# Patient Record
Sex: Female | Born: 1975 | Hispanic: No | Marital: Single | Smoking: Former smoker
Health system: Southern US, Community
[De-identification: ages and names within clinical notes are randomized; demographics above are authoritative.]

## PROBLEM LIST (undated history)

## (undated) DIAGNOSIS — F32A Depression, unspecified: Secondary | ICD-10-CM

## (undated) DIAGNOSIS — G4730 Sleep apnea, unspecified: Secondary | ICD-10-CM

## (undated) DIAGNOSIS — E039 Hypothyroidism, unspecified: Secondary | ICD-10-CM

## (undated) DIAGNOSIS — F329 Major depressive disorder, single episode, unspecified: Secondary | ICD-10-CM

## (undated) DIAGNOSIS — F419 Anxiety disorder, unspecified: Secondary | ICD-10-CM

---

## 2001-10-26 ENCOUNTER — Encounter: Admission: RE | Admit: 2001-10-26 | Discharge: 2001-11-23 | Payer: Self-pay | Admitting: Family Medicine

## 2009-01-21 ENCOUNTER — Emergency Department (HOSPITAL_COMMUNITY): Admission: EM | Admit: 2009-01-21 | Discharge: 2009-01-21 | Payer: Self-pay | Admitting: Family Medicine

## 2009-02-21 ENCOUNTER — Encounter: Admission: RE | Admit: 2009-02-21 | Discharge: 2009-04-19 | Payer: Self-pay | Admitting: Occupational Medicine

## 2009-03-30 ENCOUNTER — Encounter: Admission: RE | Admit: 2009-03-30 | Discharge: 2009-04-05 | Payer: Self-pay | Admitting: Occupational Medicine

## 2009-05-10 ENCOUNTER — Encounter: Admission: RE | Admit: 2009-05-10 | Discharge: 2009-05-29 | Payer: Self-pay | Admitting: Occupational Medicine

## 2010-06-18 ENCOUNTER — Ambulatory Visit: Payer: BC Managed Care – PPO | Attending: Orthopedic Surgery | Admitting: Physical Therapy

## 2010-06-18 DIAGNOSIS — R5381 Other malaise: Secondary | ICD-10-CM | POA: Insufficient documentation

## 2010-06-18 DIAGNOSIS — M25569 Pain in unspecified knee: Secondary | ICD-10-CM | POA: Insufficient documentation

## 2010-06-18 DIAGNOSIS — IMO0001 Reserved for inherently not codable concepts without codable children: Secondary | ICD-10-CM | POA: Insufficient documentation

## 2010-06-20 ENCOUNTER — Ambulatory Visit: Payer: BC Managed Care – PPO | Admitting: Physical Therapy

## 2010-06-26 ENCOUNTER — Ambulatory Visit: Payer: BC Managed Care – PPO | Admitting: Physical Therapy

## 2010-06-28 ENCOUNTER — Ambulatory Visit: Payer: BC Managed Care – PPO | Attending: Orthopedic Surgery | Admitting: Physical Therapy

## 2010-06-28 DIAGNOSIS — IMO0001 Reserved for inherently not codable concepts without codable children: Secondary | ICD-10-CM | POA: Insufficient documentation

## 2010-06-28 DIAGNOSIS — R5381 Other malaise: Secondary | ICD-10-CM | POA: Insufficient documentation

## 2010-06-28 DIAGNOSIS — M25569 Pain in unspecified knee: Secondary | ICD-10-CM | POA: Insufficient documentation

## 2010-07-02 ENCOUNTER — Ambulatory Visit: Payer: BC Managed Care – PPO | Admitting: Physical Therapy

## 2010-07-05 ENCOUNTER — Ambulatory Visit: Payer: BC Managed Care – PPO | Admitting: Physical Therapy

## 2010-07-09 ENCOUNTER — Ambulatory Visit: Payer: BC Managed Care – PPO | Admitting: Physical Therapy

## 2013-02-23 ENCOUNTER — Other Ambulatory Visit: Payer: Self-pay | Admitting: Nurse Practitioner

## 2014-03-02 ENCOUNTER — Ambulatory Visit
Admission: RE | Admit: 2014-03-02 | Discharge: 2014-03-02 | Disposition: A | Discharge status: Home / Self Care | Payer: BC Managed Care – PPO | Source: Ambulatory Visit | Attending: Family Medicine | Admitting: Family Medicine

## 2014-03-02 ENCOUNTER — Other Ambulatory Visit: Payer: Self-pay | Admitting: Family Medicine

## 2014-03-02 DIAGNOSIS — M25531 Pain in right wrist: Secondary | ICD-10-CM

## 2015-04-26 IMAGING — CR DG WRIST COMPLETE 3+V*R*
2 series · 2 of 2 positions shown · non-contrast
Comparison: None.

CLINICAL DATA: Medial wrist pain for several weeks, no injury

EXAM:
RIGHT WRIST - COMPLETE 3+ VIEW

[view not recorded (1 of 2)]
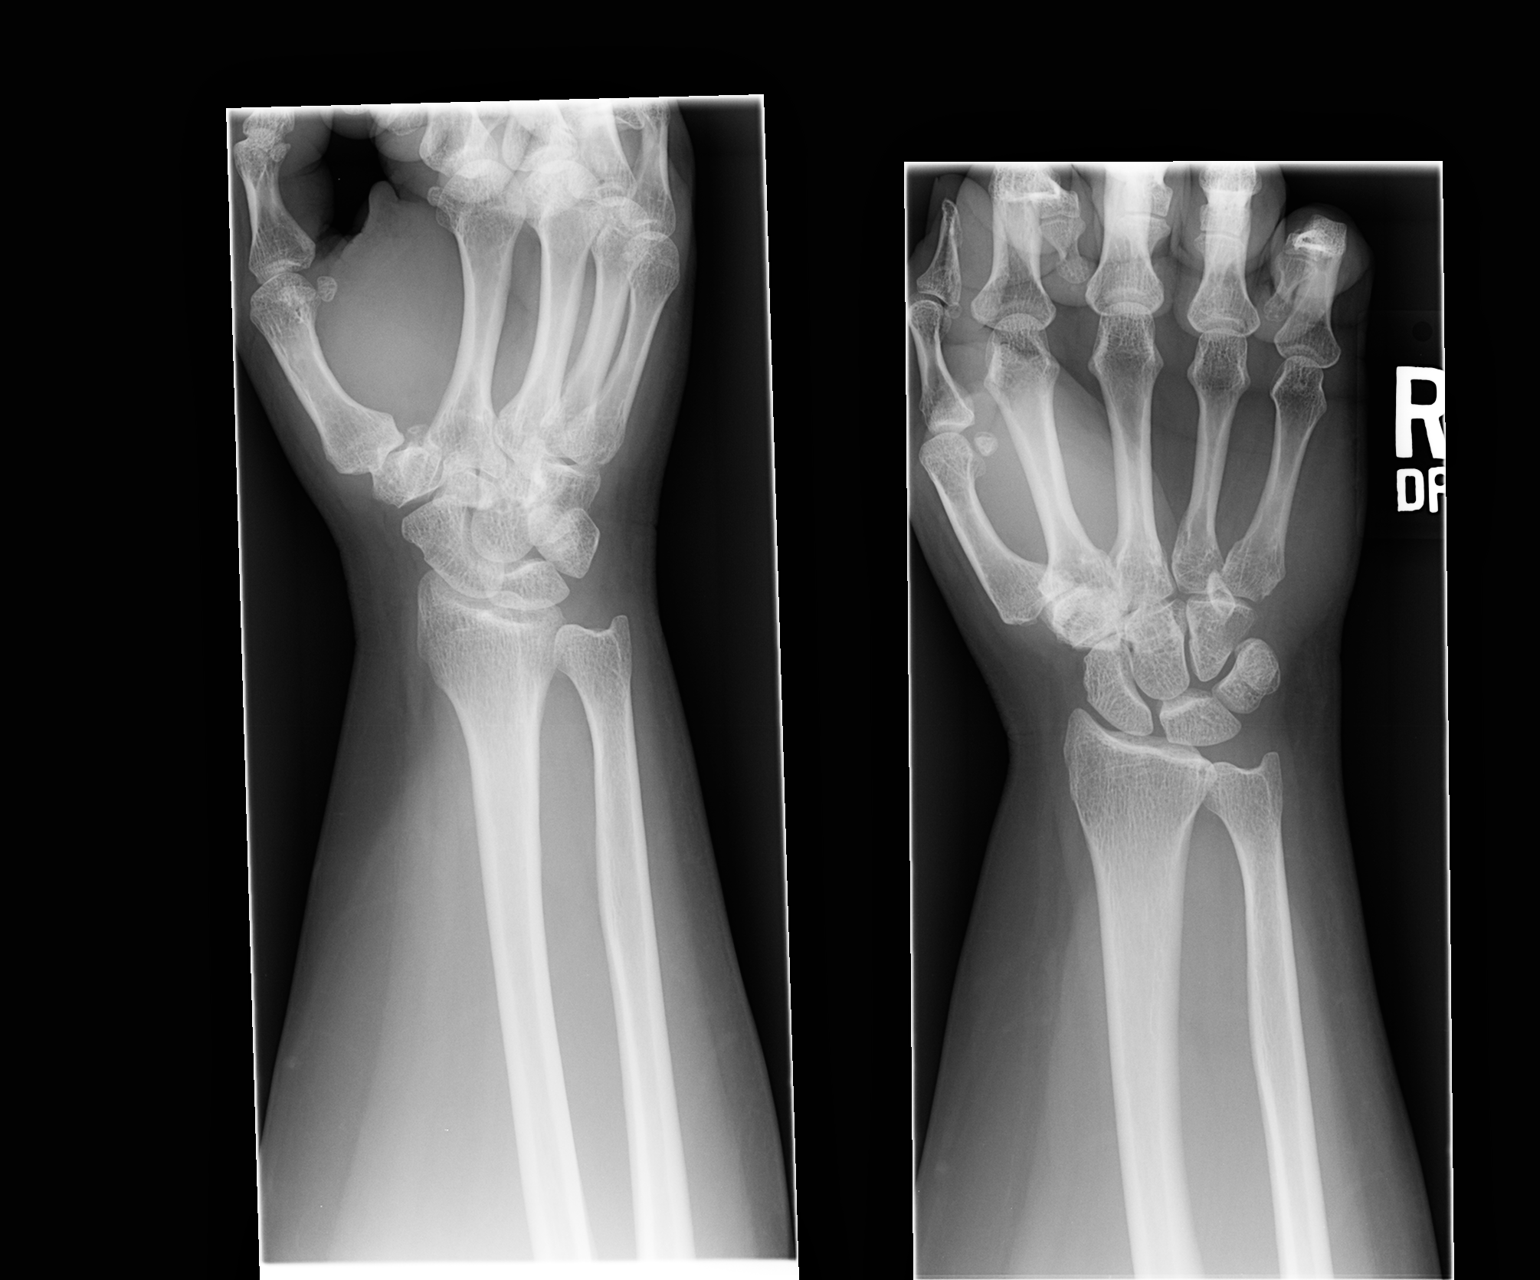

[view not recorded (2 of 2)]
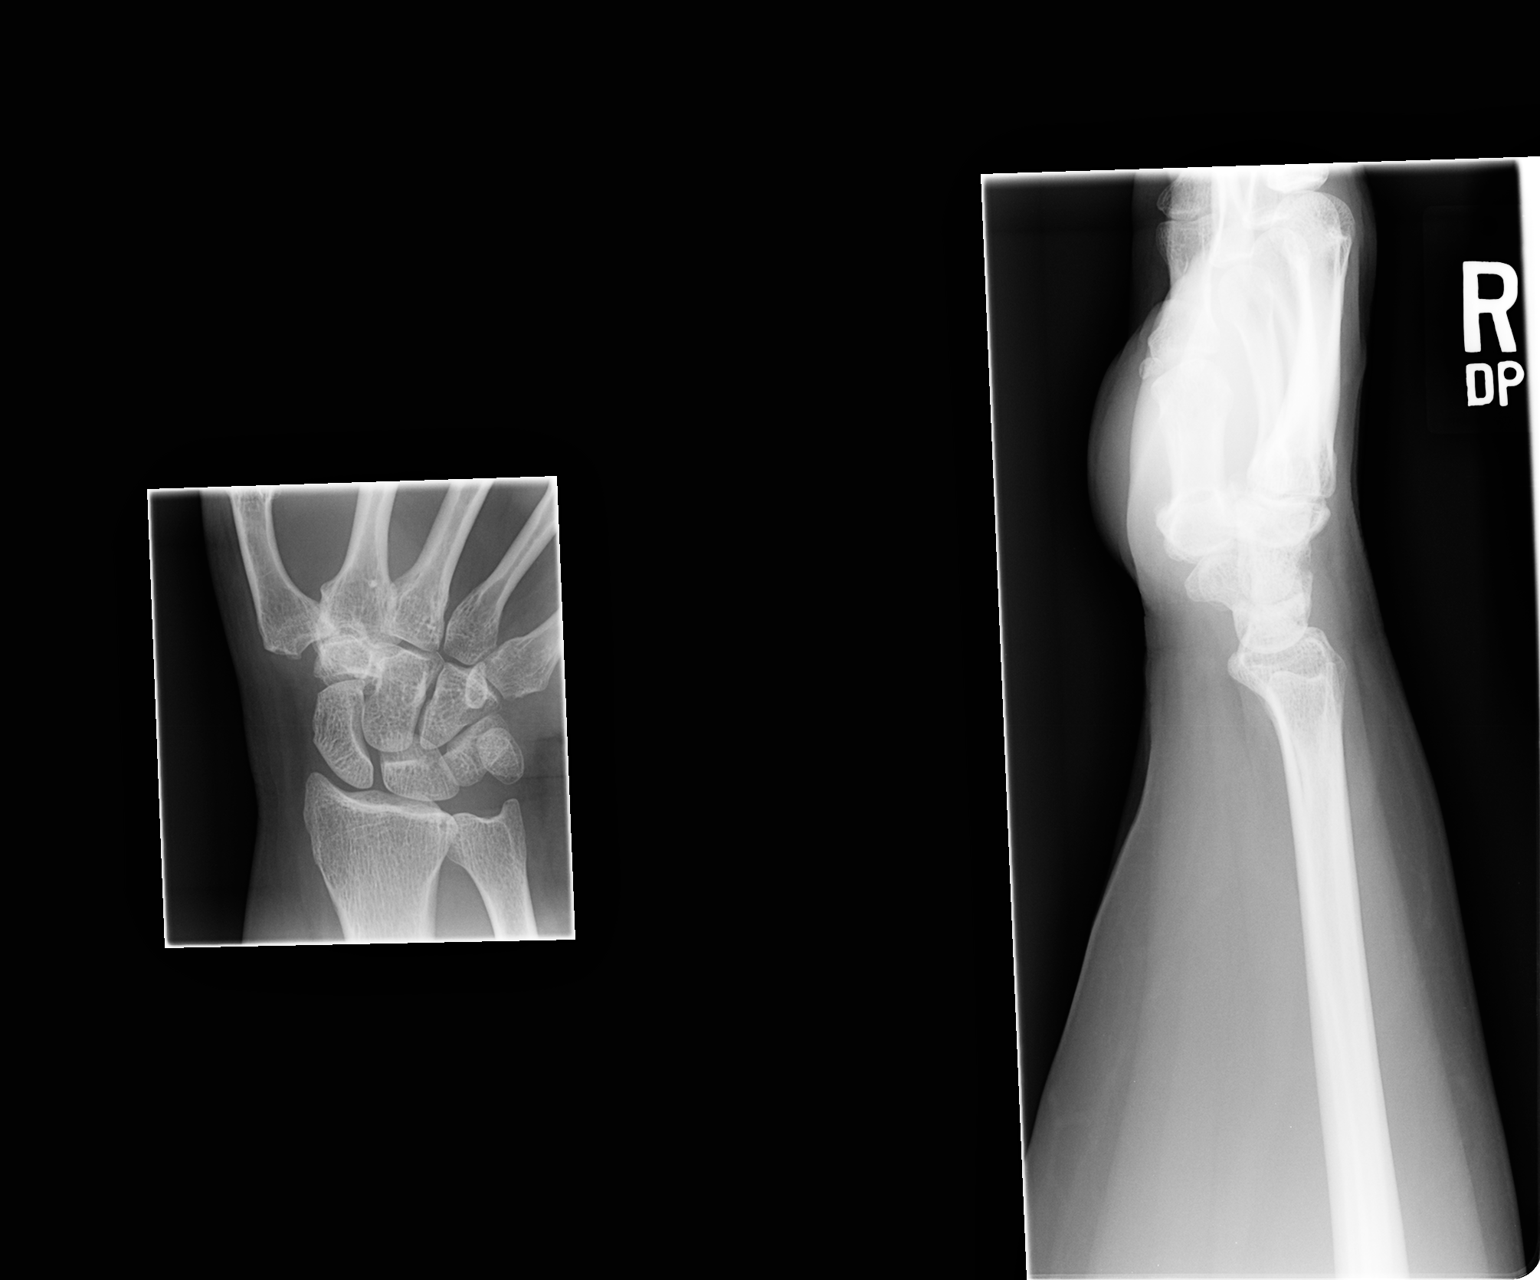

[2 of 2 positions shown; findings below may reference images not displayed]

FINDINGS: The radiocarpal joint space is unremarkable. The ulnar styloid is
intact. The carpal bones are in normal position. No acute
abnormality is seen and no significant degenerative change is noted.
IMPRESSION: Negative.

## 2018-03-31 ENCOUNTER — Encounter (HOSPITAL_COMMUNITY): Payer: Self-pay | Admitting: *Deleted

## 2018-03-31 ENCOUNTER — Emergency Department (HOSPITAL_COMMUNITY): Payer: Managed Care, Other (non HMO)

## 2018-03-31 ENCOUNTER — Emergency Department (HOSPITAL_COMMUNITY)
Admission: EM | Admit: 2018-03-31 | Discharge: 2018-03-31 | Disposition: A | Discharge status: Home / Self Care | Payer: Managed Care, Other (non HMO) | Attending: Emergency Medicine | Admitting: Emergency Medicine

## 2018-03-31 DIAGNOSIS — R0789 Other chest pain: Secondary | ICD-10-CM | POA: Diagnosis present

## 2018-03-31 DIAGNOSIS — E039 Hypothyroidism, unspecified: Secondary | ICD-10-CM | POA: Insufficient documentation

## 2018-03-31 DIAGNOSIS — Z79899 Other long term (current) drug therapy: Secondary | ICD-10-CM | POA: Diagnosis not present

## 2018-03-31 DIAGNOSIS — Z87891 Personal history of nicotine dependence: Secondary | ICD-10-CM | POA: Insufficient documentation

## 2018-03-31 HISTORY — DX: Depression, unspecified: F32.A

## 2018-03-31 HISTORY — DX: Major depressive disorder, single episode, unspecified: F32.9

## 2018-03-31 HISTORY — DX: Sleep apnea, unspecified: G47.30

## 2018-03-31 HISTORY — DX: Hypothyroidism, unspecified: E03.9

## 2018-03-31 HISTORY — DX: Anxiety disorder, unspecified: F41.9

## 2018-03-31 LAB — I-STAT TROPONIN, ED
Troponin i, poc: 0.01 ng/mL (ref 0.00–0.08)
Troponin i, poc: 0 ng/mL (ref 0.00–0.08)

## 2018-03-31 LAB — BASIC METABOLIC PANEL
Anion gap: 8 (ref 5–15)
BUN: 21 mg/dL — ABNORMAL HIGH (ref 6–20)
CO2: 27 mmol/L (ref 22–32)
Calcium: 8.9 mg/dL (ref 8.9–10.3)
Chloride: 107 mmol/L (ref 98–111)
Creatinine, Ser: 0.95 mg/dL (ref 0.44–1.00)
GFR calc Af Amer: >60 mL/min (ref >60)
GFR calc non Af Amer: >60 mL/min (ref >60)
Glucose, Bld: 109 mg/dL — ABNORMAL HIGH (ref 70–99)
Potassium: 4.4 mmol/L (ref 3.5–5.1)
Sodium: 142 mmol/L (ref 135–145)

## 2018-03-31 LAB — CBC
HCT: 41.9 % (ref 36.0–46.0)
Hemoglobin: 13.7 g/dL (ref 12.0–15.0)
MCH: 30.5 pg (ref 26.0–34.0)
MCHC: 32.7 g/dL (ref 30.0–36.0)
MCV: 93.3 fL (ref 80.0–100.0)
Platelets: 226 10*3/uL (ref 150–400)
RBC: 4.49 MIL/uL (ref 3.87–5.11)
RDW: 12.6 % (ref 11.5–15.5)
WBC: 11.2 10*3/uL — ABNORMAL HIGH (ref 4.0–10.5)
nRBC: 0 % (ref 0.0–0.2)

## 2018-03-31 LAB — I-STAT BETA HCG BLOOD, ED (MC, WL, AP ONLY): I-stat hCG, quantitative: <5 m[IU]/mL (ref <5)

## 2018-03-31 NOTE — ED Provider Notes (Signed)
[facility]   CSN: [id number] Arrival date & time: 03/31/18  1531     History   Chief Complaint Chief Complaint  Patient presents with  . Chest Pain    HPI Jasmine Vincent is a 42 y.o. female with past medical history of anxiety, depression, hypothyroid, sleep apnea, presenting to the emergency department with complaint of gradual onset of chest pressure that began this morning around 10:30 AM.  Pain is intermittent, associated with palpitations described as her heart "beating really hard."  She states her heart is not beating fast, it was just beating hard.  Symptoms began while she was working at her computer.  She endorses increase stress lately with her job.  Symptoms are not associated with shortness of breath, nausea, or diaphoresis.  She does have some intermittent lightheadedness today.  Similar symptoms 2 weeks ago, lasted only about a minute in duration.  Early this morning she took Sudafed for a sinus infection.  No other medications tried for her symptoms today.  No cardiac history.  No significant family history.  No history of DVT or PE, no recent immobilization, surgery, travel, exogenous estrogen use, hemoptysis, unilateral leg pain or swelling.  The history is provided by the patient.    Past Medical History:  Diagnosis Date  . Anxiety   . Depression   . Hypothyroidism   . Sleep apnea     There are no active problems to display for this patient.   History reviewed. No pertinent surgical history.   OB History   None      Home Medications    Prior to Admission medications   Medication Sig Start Date End Date Taking? Authorizing Provider  thyroid (ARMOUR) 195 MG tablet Take 195 mg by mouth daily. 3 grains   Yes [provider]    Family History History reviewed. No pertinent family history.  Social History Social History   Tobacco Use  . Smoking status: Former Games developermoker  . Smokeless  tobacco: Never Used  Substance Use Topics  . Alcohol use: Not on file  . Drug use: Not on file     Allergies   Patient has no known allergies.   Review of Systems Review of Systems  Constitutional: Negative for diaphoresis.  Respiratory: Negative for cough and shortness of breath.   Cardiovascular: Positive for chest pain and palpitations. Negative for leg swelling.  Gastrointestinal: Negative for nausea.  Psychiatric/Behavioral: The patient is nervous/anxious.   All other systems reviewed and are negative.    Physical Exam Updated Vital Signs BP 134/88   Pulse 80   Temp 98.6 F (37 C) (Oral)   Resp 17   LMP 03/10/2018   SpO2 94%   Physical Exam  Constitutional: She appears well-developed and well-nourished. No distress.  HENT:  Head: Normocephalic and atraumatic.  Eyes: Conjunctivae are normal.  Neck: Normal range of motion. Neck supple. No JVD present. No tracheal deviation present.  Cardiovascular: Normal rate, regular rhythm, normal heart sounds and intact distal pulses.  Pulmonary/Chest: Effort normal and breath sounds normal. No respiratory distress. She exhibits tenderness.  There is some tenderness to palpation of the anterior chest wall over the sternum and left chest.  Abdominal: Soft. Bowel sounds are normal. She exhibits no distension. There is no tenderness. There is no guarding.  Musculoskeletal: She exhibits no edema.  Neurological: She is alert.  Skin: Skin is warm.  Psychiatric: She has a normal mood and affect. Her behavior is  normal.  Nursing note and vitals reviewed.    ED Treatments / Results  Labs (all labs ordered are listed, but only abnormal results are displayed) Labs Reviewed  BASIC METABOLIC PANEL - Abnormal; Notable for the following components:      Result Value   Glucose, Bld 109 (*)    BUN 21 (*)    All other components within normal limits  CBC - Abnormal; Notable for the following components:   WBC 11.2 (*)    All other  components within normal limits  I-STAT TROPONIN, ED  I-STAT BETA HCG BLOOD, ED ([facility], [facility], [facility] ONLY)  I-STAT TROPONIN, ED    EKG None  Radiology Dg Chest 2 View  Result Date: 03/31/2018 CLINICAL DATA:  Chest pain and shortness of breath EXAM: CHEST - 2 VIEW COMPARISON:  None. FINDINGS: Normal heart size and mediastinal contours. No acute infiltrate or edema. No effusion or pneumothorax. No acute osseous findings. IMPRESSION: Negative chest. Electronically Signed   By: Marnee Spring M.D.   On: 03/31/2018 16:10    Procedures Procedures (including critical care time)  Medications Ordered in ED Medications - No data to display   Initial Impression / Assessment and Plan / ED Course  I have reviewed the triage vital signs and the nursing notes.  Pertinent labs & imaging results that were available during my care of the patient were reviewed by me and considered in my medical decision making (see chart for details).     Pt presenting with atypical chest pain that began at rest this morning. Suspect anxiety/stress as an aggravating factor. Chest pain is not likely of cardiac or pulmonary etiology d/t presentation, low risk wells, VSS, no tracheal deviation, no JVD or new murmur, RRR, breath sounds equal bilaterally, EKG without acute abnormalities, negative troponin x2, and negative CXR. Low heart score. Patient is to be discharged with recommendation to follow up with PCP in regards to today's hospital visit. Pt has been advised to return to the ED if CP becomes exertional, associated with diaphoresis or nausea, radiates to left jaw/arm, worsens or becomes concerning in any way. Pt appears reliable for follow up and is agreeable to discharge.   Discussed results, findings, treatment and follow up. Patient advised of return precautions. Patient verbalized understanding and agreed with plan.  Final Clinical Impressions(s) / ED Diagnoses   Final diagnoses:  Atypical chest pain    ED  Discharge Orders    None       Robinson, [place] N, PA-C 03/31/18 1950    Rolan Bucco, MD 04/01/18 [phone]

## 2018-03-31 NOTE — ED Notes (Signed)
Pt stable and ambulatory for discharge, states understanding follow up.  

## 2018-03-31 NOTE — ED Triage Notes (Signed)
Pt in c/o chest pain that started this morning, states it feels like her heart is pounding, denies shortness of breath or n/v, no distress noted

## 2018-03-31 NOTE — Discharge Instructions (Signed)
Please read instructions below.  °Return to the ER for new or worsening symptoms; including worsening chest pain, shortness of breath, pain that radiates to the arm or neck, pain or shortness of breath worsened with exertion.  °Follow up with your primary care provider. ° °

## 2019-05-25 IMAGING — CR DG CHEST 2V
2 series · 2 of 2 positions shown · non-contrast
Comparison: None.

CLINICAL DATA: Chest pain and shortness of breath

EXAM:
CHEST - 2 VIEW

[chest pa]
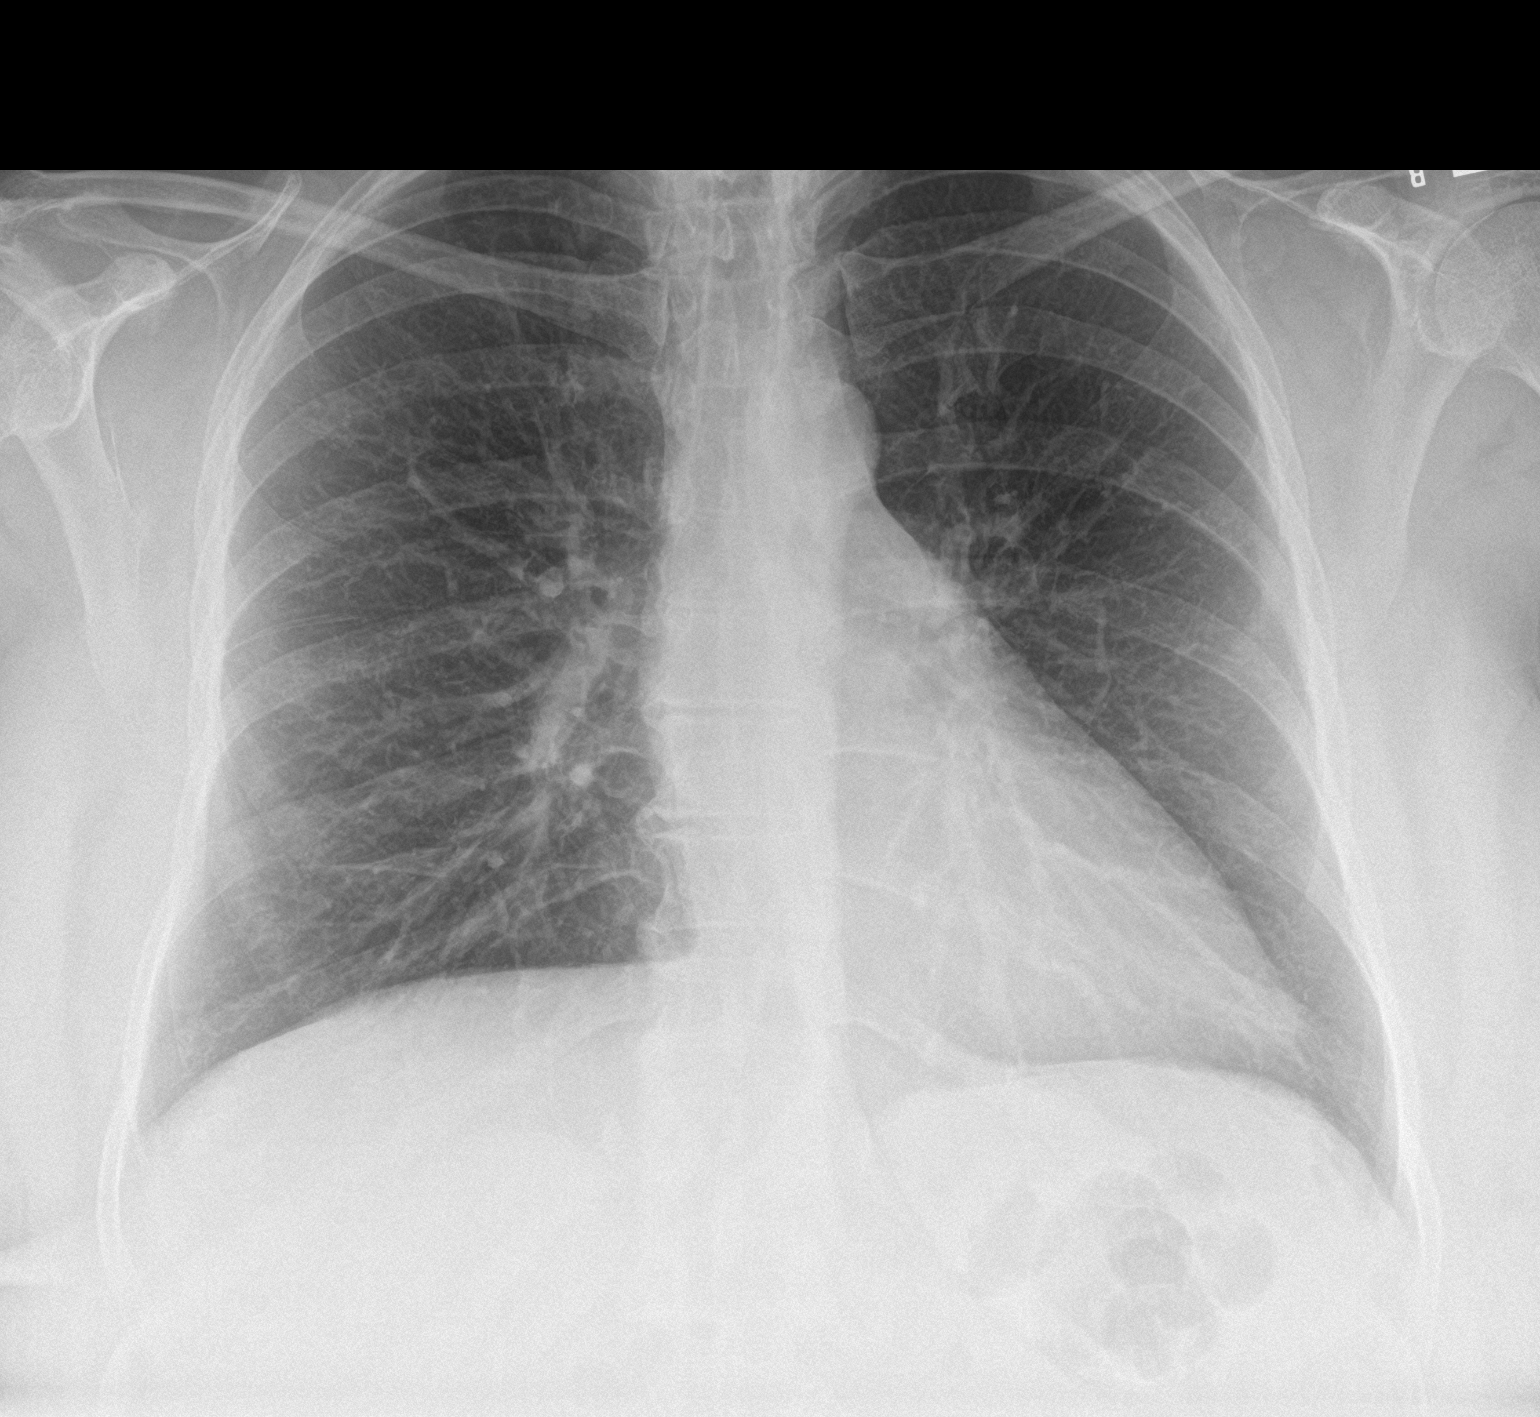

[chest lat]
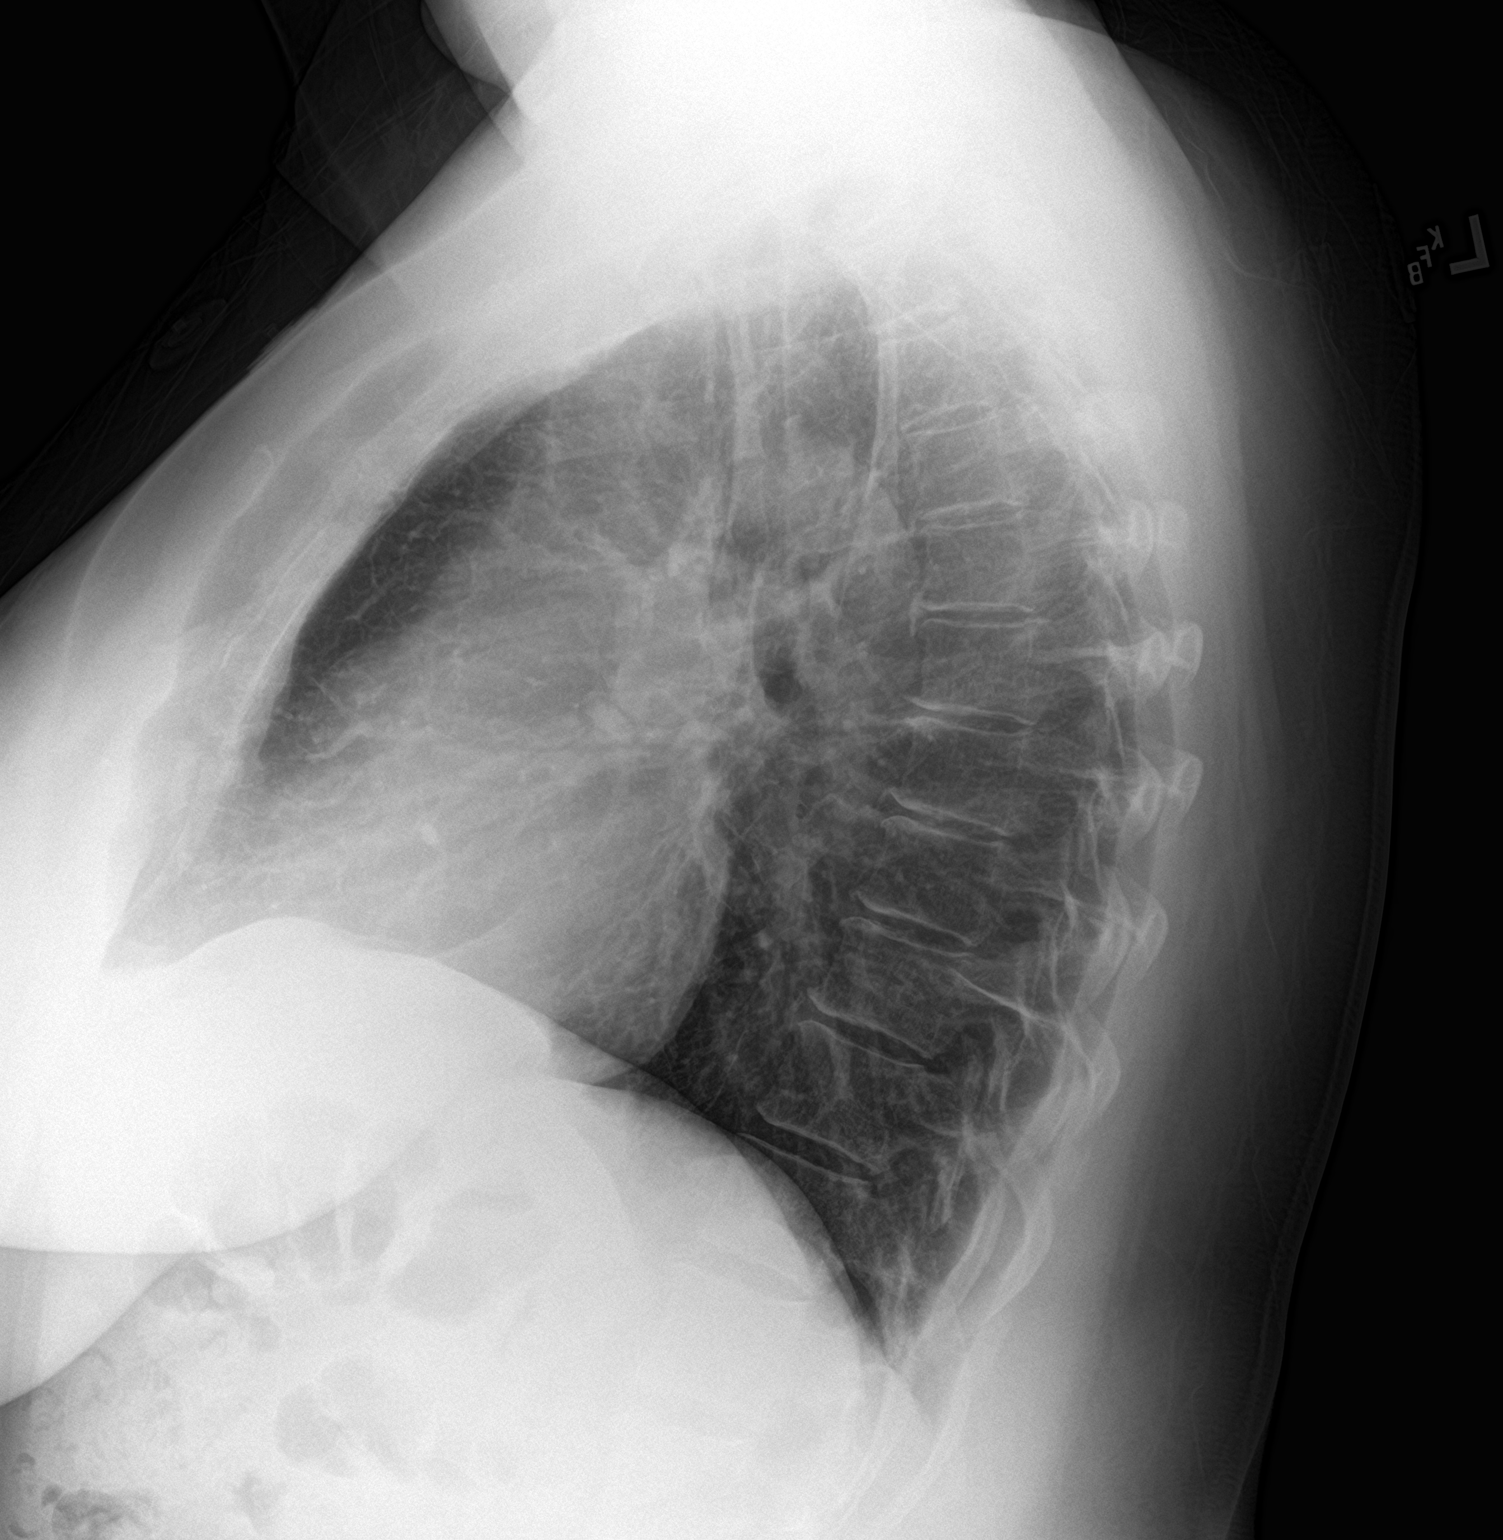

[2 of 2 positions shown; findings below may reference images not displayed]

FINDINGS: Normal heart size and mediastinal contours. No acute infiltrate or
edema. No effusion or pneumothorax. No acute osseous findings.
IMPRESSION: Negative chest.

## 2023-06-06 ENCOUNTER — Other Ambulatory Visit: Payer: Self-pay

## 2023-06-06 ENCOUNTER — Emergency Department (HOSPITAL_COMMUNITY): Payer: 59

## 2023-06-06 ENCOUNTER — Emergency Department (HOSPITAL_COMMUNITY)
Admission: EM | Admit: 2023-06-06 | Discharge: 2023-06-06 | Disposition: A | Discharge status: Home / Self Care | Payer: 59 | Attending: Emergency Medicine | Admitting: Emergency Medicine

## 2023-06-06 ENCOUNTER — Encounter (HOSPITAL_COMMUNITY): Payer: Self-pay | Admitting: Emergency Medicine

## 2023-06-06 DIAGNOSIS — R079 Chest pain, unspecified: Secondary | ICD-10-CM

## 2023-06-06 DIAGNOSIS — R200 Anesthesia of skin: Secondary | ICD-10-CM | POA: Insufficient documentation

## 2023-06-06 DIAGNOSIS — G5602 Carpal tunnel syndrome, left upper limb: Secondary | ICD-10-CM

## 2023-06-06 DIAGNOSIS — E039 Hypothyroidism, unspecified: Secondary | ICD-10-CM | POA: Diagnosis not present

## 2023-06-06 LAB — CBC
HCT: 42.6 % (ref 36.0–46.0)
Hemoglobin: 14.5 g/dL (ref 12.0–15.0)
MCH: 30.7 pg (ref 26.0–34.0)
MCHC: 34 g/dL (ref 30.0–36.0)
MCV: 90.1 fL (ref 80.0–100.0)
Platelets: 221 10*3/uL (ref 150–400)
RBC: 4.73 MIL/uL (ref 3.87–5.11)
RDW: 13.8 % (ref 11.5–15.5)
WBC: 11.2 10*3/uL — ABNORMAL HIGH (ref 4.0–10.5)
nRBC: 0 % (ref 0.0–0.2)

## 2023-06-06 LAB — URINALYSIS, ROUTINE W REFLEX MICROSCOPIC
Bilirubin Urine: NEGATIVE
Glucose, UA: NEGATIVE mg/dL
Hgb urine dipstick: NEGATIVE
Ketones, ur: NEGATIVE mg/dL
Leukocytes,Ua: NEGATIVE
Nitrite: NEGATIVE
Protein, ur: NEGATIVE mg/dL
Specific Gravity, Urine: 1.011 (ref 1.005–1.030)
pH: 6 (ref 5.0–8.0)

## 2023-06-06 LAB — DIFFERENTIAL
Abs Immature Granulocytes: 0.06 10*3/uL (ref 0.00–0.07)
Basophils Absolute: 0 10*3/uL (ref 0.0–0.1)
Basophils Relative: 0 %
Eosinophils Absolute: 0 10*3/uL (ref 0.0–0.5)
Eosinophils Relative: 0 %
Immature Granulocytes: 1 %
Lymphocytes Relative: 10 %
Lymphs Abs: 1.1 10*3/uL (ref 0.7–4.0)
Monocytes Absolute: 0.7 10*3/uL (ref 0.1–1.0)
Monocytes Relative: 6 %
Neutro Abs: 9.3 10*3/uL — ABNORMAL HIGH (ref 1.7–7.7)
Neutrophils Relative %: 83 %

## 2023-06-06 LAB — HCG, SERUM, QUALITATIVE: Preg, Serum: NEGATIVE

## 2023-06-06 LAB — COMPREHENSIVE METABOLIC PANEL
ALT: 23 U/L (ref 0–44)
AST: 20 U/L (ref 15–41)
Albumin: 3.8 g/dL (ref 3.5–5.0)
Alkaline Phosphatase: 96 U/L (ref 38–126)
Anion gap: 10 (ref 5–15)
BUN: 19 mg/dL (ref 6–20)
CO2: 25 mmol/L (ref 22–32)
Calcium: 8.9 mg/dL (ref 8.9–10.3)
Chloride: 102 mmol/L (ref 98–111)
Creatinine, Ser: 0.81 mg/dL (ref 0.44–1.00)
GFR, Estimated: >60 mL/min (ref >60)
Glucose, Bld: 144 mg/dL — ABNORMAL HIGH (ref 70–99)
Potassium: 4 mmol/L (ref 3.5–5.1)
Sodium: 137 mmol/L (ref 135–145)
Total Bilirubin: 0.8 mg/dL (ref 0.0–1.2)
Total Protein: 7.3 g/dL (ref 6.5–8.1)

## 2023-06-06 LAB — I-STAT CHEM 8, ED
BUN: 20 mg/dL (ref 6–20)
Calcium, Ion: 1.1 mmol/L — ABNORMAL LOW (ref 1.15–1.40)
Chloride: 104 mmol/L (ref 98–111)
Creatinine, Ser: 0.8 mg/dL (ref 0.44–1.00)
Glucose, Bld: 141 mg/dL — ABNORMAL HIGH (ref 70–99)
HCT: 44 % (ref 36.0–46.0)
Hemoglobin: 15 g/dL (ref 12.0–15.0)
Potassium: 4 mmol/L (ref 3.5–5.1)
Sodium: 140 mmol/L (ref 135–145)
TCO2: 25 mmol/L (ref 22–32)

## 2023-06-06 LAB — ETHANOL: Alcohol, Ethyl (B): <10 mg/dL (ref <10)

## 2023-06-06 LAB — APTT: aPTT: 30 s (ref 24–36)

## 2023-06-06 LAB — TROPONIN I (HIGH SENSITIVITY): Troponin I (High Sensitivity): 5 ng/L (ref <18)

## 2023-06-06 NOTE — ED Triage Notes (Signed)
Patient complaining of left arm numbness, chest pain, and hypertension. Symptoms discovered 0100. LKW 7pm. Patient states numbness has improved but still present.

## 2023-06-06 NOTE — ED Provider Triage Note (Signed)
Emergency Medicine Provider Triage Evaluation Note  Jasmine Vincent , a 48 y.o. female  was evaluated in triage.  Pt complains of arm numbness.  Patient awoke from sleep with numbness of the left arm.  She was last known well at 7 PM.  No weakness, no leg or facial involvement.  Patient states that she was not sleeping with her arm tucked beneath her above her head.  She now has persistent numbness in her left ring finger.  She has no weakness.  Review of Systems  Positive: Left arm paresthesia Negative: Weakness  Physical Exam  BP (!) 150/99 (BP Location: Right Arm)   Pulse 98   Temp 98.1 F (36.7 C) (Oral)   Resp 18   Ht 5\' 2"  (1.575 m)   Wt 111.1 kg   SpO2 96%   BMI 44.81 kg/m  Gen:   Awake, no distress   Resp:  Normal effort  MSK:   Moves extremities without difficulty  Other:    Medical Decision Making  Medically screening exam initiated at 6:44 AM.  Appropriate orders placed.  Jasmine Vincent was informed that the remainder of the evaluation will be completed by another provider, this initial triage assessment does not replace that evaluation, and the importance of remaining in the ED until their evaluation is complete.     Jasmine Captain, PA-C 06/06/23 [phone]

## 2023-06-06 NOTE — ED Provider Notes (Signed)
[place] EMERGENCY DEPARTMENT AT [facility] Provider Note   CSN: [id number] Arrival date & time: 06/06/23  0602     History  Chief Complaint  Patient presents with   Numbness    Jowana Thumma is a 48 y.o. female past medical history of hypothyroidism, anxiety presenting to emergency room with complaint of central chest pain that has been present since she woke up this morning around 4 AM.  Patient reports it is a mild central chest pain radiating to her left arm.  Feels like maybe the chest pain is related to fluttering feeling in her chest. Patient notes that she feels very anxious about the chest pain however denies any shortness of breath, dizziness lightheadedness or weakness.  Denies any cough, congestion, fevers or chills.  HPI     Home Medications Prior to Admission medications   Medication Sig Start Date End Date Taking? Authorizing Provider  thyroid (ARMOUR) 195 MG tablet Take 195 mg by mouth daily. 3 grains    [provider]      Allergies    Patient has no known allergies.    Review of Systems   Review of Systems  Respiratory:  Negative for chest tightness.   Cardiovascular:  Positive for chest pain.    Physical Exam Updated Vital Signs BP (!) 138/97 (BP Location: Left Arm)   Pulse 91   Temp 98.4 F (36.9 C) (Oral)   Resp 18   Ht 5\' 2"  (1.575 m)   Wt 111.1 kg   SpO2 97%   BMI 44.81 kg/m  Physical Exam Vitals and nursing note reviewed.  Constitutional:      General: She is not in acute distress.    Appearance: She is not toxic-appearing.  HENT:     Head: Normocephalic and atraumatic.  Eyes:     General: No scleral icterus.    Conjunctiva/sclera: Conjunctivae normal.  Cardiovascular:     Rate and Rhythm: Normal rate and regular rhythm.     Pulses: Normal pulses.     Heart sounds: Normal heart sounds.  Pulmonary:     Effort: Pulmonary effort is normal. No respiratory distress.     Breath sounds: Normal breath sounds.      Comments: No sign of respiratory distress.  No increased respiratory effort.  Lungs clear to auscultation bilaterally. Abdominal:     General: Abdomen is flat. Bowel sounds are normal.     Palpations: Abdomen is soft.     Tenderness: There is no abdominal tenderness.  Musculoskeletal:     Right lower leg: No edema.     Left lower leg: No edema.  Skin:    General: Skin is warm and dry.     Findings: No lesion.  Neurological:     General: No focal deficit present.     Mental Status: She is alert and oriented to person, place, and time. Mental status is at baseline.     Comments: Patient with upper extremity strength including grip strength equal bilaterally.  Sensation equal bilaterally.  Strong radial pulse equal bilaterally.  Lower extremity strength equal bilaterally, sensation equal bilaterally.  No ataxia No cranial nerve deficits.  Patient alert oriented answering questions appropriately with no slurred speech.  No nystagmus.     ED Results / Procedures / Treatments   Labs (all labs ordered are listed, but only abnormal results are displayed) Labs Reviewed  CBC - Abnormal; Notable for the following components:      Result Value  WBC 11.2 (*)    All other components within normal limits  DIFFERENTIAL - Abnormal; Notable for the following components:   Neutro Abs 9.3 (*)    All other components within normal limits  COMPREHENSIVE METABOLIC PANEL - Abnormal; Notable for the following components:   Glucose, Bld 144 (*)    All other components within normal limits  URINALYSIS, ROUTINE W REFLEX MICROSCOPIC - Abnormal; Notable for the following components:   Color, Urine STRAW (*)    All other components within normal limits  I-STAT CHEM 8, ED - Abnormal; Notable for the following components:   Glucose, Bld 141 (*)    Calcium, Ion 1.10 (*)    All other components within normal limits  ETHANOL  APTT  HCG, SERUM, QUALITATIVE    EKG None  Radiology CT Head Wo  Contrast Result Date: 06/06/2023 CLINICAL DATA:  Hypertension and left arm numbness. Chronic neck pain. EXAM: CT HEAD WITHOUT CONTRAST CT CERVICAL SPINE WITHOUT CONTRAST TECHNIQUE: Multidetector CT imaging of the head and cervical spine was performed following the standard protocol without intravenous contrast. Multiplanar CT image reconstructions of the cervical spine were also generated. RADIATION DOSE REDUCTION: This exam was performed according to the departmental dose-optimization program which includes automated exposure control, adjustment of the mA and/or kV according to patient size and/or use of iterative reconstruction technique. COMPARISON:  None Available. FINDINGS: CT HEAD FINDINGS Brain: No evidence of acute infarction, hemorrhage, hydrocephalus, extra-axial collection or mass lesion/mass effect. Vascular: No hyperdense vessel or unexpected calcification. Skull: Normal. Negative for fracture or focal lesion. Sinuses/Orbits: No acute finding. CT CERVICAL SPINE FINDINGS Alignment: Normal. Skull base and vertebrae: No acute fracture. No primary bone lesion or focal pathologic process. Soft tissues and spinal canal: No prevertebral fluid or swelling. No visible canal hematoma. Disc levels: Mild degenerative spurring affecting C5-6 and C6-7 disc spaces. No bony impingement. Upper chest: Clear apical lungs IMPRESSION: 1. Negative head CT. 2. No acute finding in the cervical spine. There is lower disc degeneration without bony impingement. Electronically Signed   By: Tiburcio Pea M.D.   On: 06/06/2023 07:26   CT Cervical Spine Wo Contrast Result Date: 06/06/2023 CLINICAL DATA:  Hypertension and left arm numbness. Chronic neck pain. EXAM: CT HEAD WITHOUT CONTRAST CT CERVICAL SPINE WITHOUT CONTRAST TECHNIQUE: Multidetector CT imaging of the head and cervical spine was performed following the standard protocol without intravenous contrast. Multiplanar CT image reconstructions of the cervical spine were  also generated. RADIATION DOSE REDUCTION: This exam was performed according to the departmental dose-optimization program which includes automated exposure control, adjustment of the mA and/or kV according to patient size and/or use of iterative reconstruction technique. COMPARISON:  None Available. FINDINGS: CT HEAD FINDINGS Brain: No evidence of acute infarction, hemorrhage, hydrocephalus, extra-axial collection or mass lesion/mass effect. Vascular: No hyperdense vessel or unexpected calcification. Skull: Normal. Negative for fracture or focal lesion. Sinuses/Orbits: No acute finding. CT CERVICAL SPINE FINDINGS Alignment: Normal. Skull base and vertebrae: No acute fracture. No primary bone lesion or focal pathologic process. Soft tissues and spinal canal: No prevertebral fluid or swelling. No visible canal hematoma. Disc levels: Mild degenerative spurring affecting C5-6 and C6-7 disc spaces. No bony impingement. Upper chest: Clear apical lungs IMPRESSION: 1. Negative head CT. 2. No acute finding in the cervical spine. There is lower disc degeneration without bony impingement. Electronically Signed   By: Tiburcio Pea M.D.   On: 06/06/2023 07:26    Procedures Procedures    Medications Ordered in ED  Medications - No data to display  ED Course/ Medical Decision Making/ A&P                                 Medical Decision Making Amount and/or Complexity of Data Reviewed Radiology: ordered.   Kandy Towery 48 y.o. presented today for chest pain. Working DDx that I considered at this time includes, but not limited to, ACS, GERD, pe, pna, aortic dissection, pneumothorax, MSK path, anemia, esophageal rupture, CHF exacerbation, valvular disorder, myocarditis, pericarditis, endocarditis, pericardial effusion/cardiac tamponade, pulmonary edema, gastritis/PUD, esophagitis.  R/o Dx: These are considered less likely due to history of present illness and physical exam findings.   Review of prior  external notes: 03/31/18 ED visit   Unique Tests and My Interpretation:  CBC, CMP, troponin, urinalysis.  Chest x-ray pending.    Problem List / ED Course / Critical interventions / Medication management  Patient with no history of coronary artery disease, no history of hypertension or hyperlipidemia.  She is low risk chest pain patient.  Given durations of symptoms only 1 troponin was obtained.  Troponin was within normal limits.  EKG shows sinus with no acute changes since prior EKG.  Chest x-ray without any acute abnormality.  Patient is PERC negative thus doubt DVT PE as cause.  No significant electrolyte abnormality or leukocytosis.  No anemia.  Given that patient has reassuring workup in emergency room we will have her follow-up with primary care doctor to ensure symptoms have improved.  Patient reporting her left fingers are intermittently numb.  Feels more of a pins-and-needles feeling.  Reports palmar 2nd and 3rd digit are tingling.her symptoms are consistent with carpal tunnel.  She has good pulses equal bilaterally.  Sensation of palmar and dorsal aspect of hand is equal bilaterally when compared on PE. No focal deficits on exam. Grip strength is equal bilaterally.  Offered to order brace for wrist however patient declines.  She is hemodynamically stable and well-appearing.  Not hypoxic stable for discharge. Medication however patient declined.  Patients vitals assessed. Upon arrival patient is hemodynamically stable.  I have reviewed the patients home medicines and have made adjustments as needed    Plan: Signed off to oncoming ED PA Bowie.  Anticipate the patient is likely stable for discharge.         Final Clinical Impression(s) / ED Diagnoses Final diagnoses:  Chest pain, unspecified type  Carpal tunnel syndrome of left wrist    Rx / DC Orders ED Discharge Orders     None         Smitty Knudsen, PA-C 06/06/23 1821    Lonell Grandchild, MD 06/07/23  [phone]

## 2023-06-06 NOTE — Discharge Instructions (Signed)
Seen in the emergency room today for chest pain as well as left hand numbness.  Your left hand numbness is consistent with carpal tunnel. You can take Tylneol four times daily and naproxen two times daily. You can also try rest, ice, heat, compression or wrist brace. Your chest pain workup here is reassuring.  Please follow-up with primary care doctor.
